# Patient Record
Sex: Female | Born: 1950 | Race: White | Hispanic: No | Smoking: Never smoker
Health system: Southern US, Community
[De-identification: ages and names within clinical notes are randomized; demographics above are authoritative.]

## PROBLEM LIST (undated history)

## (undated) DIAGNOSIS — J449 Chronic obstructive pulmonary disease, unspecified: Secondary | ICD-10-CM

## (undated) DIAGNOSIS — F329 Major depressive disorder, single episode, unspecified: Secondary | ICD-10-CM

## (undated) DIAGNOSIS — K219 Gastro-esophageal reflux disease without esophagitis: Secondary | ICD-10-CM

## (undated) DIAGNOSIS — N6019 Diffuse cystic mastopathy of unspecified breast: Secondary | ICD-10-CM

## (undated) DIAGNOSIS — E785 Hyperlipidemia, unspecified: Secondary | ICD-10-CM

## (undated) DIAGNOSIS — G43909 Migraine, unspecified, not intractable, without status migrainosus: Secondary | ICD-10-CM

## (undated) DIAGNOSIS — M1990 Unspecified osteoarthritis, unspecified site: Secondary | ICD-10-CM

## (undated) DIAGNOSIS — F32A Depression, unspecified: Secondary | ICD-10-CM

## (undated) DIAGNOSIS — G4730 Sleep apnea, unspecified: Secondary | ICD-10-CM

## (undated) DIAGNOSIS — E119 Type 2 diabetes mellitus without complications: Secondary | ICD-10-CM

## (undated) DIAGNOSIS — E669 Obesity, unspecified: Secondary | ICD-10-CM

## (undated) DIAGNOSIS — D649 Anemia, unspecified: Secondary | ICD-10-CM

## (undated) DIAGNOSIS — I10 Essential (primary) hypertension: Secondary | ICD-10-CM

## (undated) HISTORY — DX: Obesity, unspecified: E66.9

## (undated) HISTORY — DX: Diffuse cystic mastopathy of unspecified breast: N60.19

## (undated) HISTORY — PX: THROAT SURGERY: SHX803

## (undated) HISTORY — DX: Chronic obstructive pulmonary disease, unspecified: J44.9

## (undated) HISTORY — PX: GASTRIC RESTRICTION SURGERY: SHX653

## (undated) HISTORY — DX: Essential (primary) hypertension: I10

## (undated) HISTORY — DX: Migraine, unspecified, not intractable, without status migrainosus: G43.909

## (undated) HISTORY — DX: Unspecified osteoarthritis, unspecified site: M19.90

## (undated) HISTORY — PX: JOINT REPLACEMENT: SHX530

## (undated) HISTORY — PX: CYSTECTOMY: SUR359

## (undated) HISTORY — DX: Anemia, unspecified: D64.9

## (undated) HISTORY — DX: Depression, unspecified: F32.A

## (undated) HISTORY — DX: Sleep apnea, unspecified: G47.30

## (undated) HISTORY — DX: Major depressive disorder, single episode, unspecified: F32.9

## (undated) HISTORY — DX: Type 2 diabetes mellitus without complications: E11.9

## (undated) HISTORY — PX: APPENDECTOMY: SHX54

## (undated) HISTORY — DX: Hyperlipidemia, unspecified: E78.5

## (undated) HISTORY — DX: Gastro-esophageal reflux disease without esophagitis: K21.9

## (undated) HISTORY — PX: ABDOMINAL HYSTERECTOMY: SHX81

---

## 1996-09-13 HISTORY — PX: FOOT SURGERY: SHX648

## 1999-09-14 HISTORY — PX: SHOULDER SURGERY: SHX246

## 1999-09-14 HISTORY — PX: COLONOSCOPY: SHX174

## 2000-09-13 HISTORY — PX: TRACHEOSTOMY: SUR1362

## 2000-09-13 HISTORY — PX: CARPAL TUNNEL RELEASE: SHX101

## 2004-09-03 ENCOUNTER — Ambulatory Visit: Payer: Self-pay | Admitting: Otolaryngology

## 2004-09-03 ENCOUNTER — Other Ambulatory Visit: Payer: Self-pay

## 2004-09-09 ENCOUNTER — Ambulatory Visit: Payer: Self-pay | Admitting: Otolaryngology

## 2004-10-30 ENCOUNTER — Ambulatory Visit: Payer: Self-pay | Admitting: Otolaryngology

## 2004-11-04 ENCOUNTER — Ambulatory Visit: Payer: Self-pay | Admitting: Otolaryngology

## 2004-12-31 ENCOUNTER — Ambulatory Visit: Payer: Self-pay | Admitting: General Surgery

## 2005-01-05 ENCOUNTER — Emergency Department: Payer: Self-pay | Admitting: Unknown Physician Specialty

## 2005-01-07 ENCOUNTER — Ambulatory Visit: Payer: Self-pay | Admitting: Family Medicine

## 2005-01-08 ENCOUNTER — Inpatient Hospital Stay: Payer: Self-pay | Admitting: Internal Medicine

## 2005-02-20 ENCOUNTER — Other Ambulatory Visit: Payer: Self-pay

## 2005-02-21 ENCOUNTER — Inpatient Hospital Stay: Payer: Self-pay | Admitting: Rheumatology

## 2005-04-22 ENCOUNTER — Emergency Department: Payer: Self-pay | Admitting: Emergency Medicine

## 2006-01-11 ENCOUNTER — Ambulatory Visit: Payer: Self-pay | Admitting: General Surgery

## 2006-03-10 ENCOUNTER — Emergency Department: Payer: Self-pay | Admitting: Emergency Medicine

## 2007-01-24 ENCOUNTER — Ambulatory Visit: Payer: Self-pay | Admitting: General Surgery

## 2008-01-15 ENCOUNTER — Ambulatory Visit: Payer: Self-pay | Admitting: Family Medicine

## 2008-02-13 ENCOUNTER — Ambulatory Visit: Payer: Self-pay | Admitting: General Surgery

## 2009-02-13 ENCOUNTER — Ambulatory Visit: Payer: Self-pay | Admitting: General Surgery

## 2010-01-12 ENCOUNTER — Emergency Department: Payer: Self-pay | Admitting: Emergency Medicine

## 2010-02-16 ENCOUNTER — Ambulatory Visit: Payer: Self-pay | Admitting: General Surgery

## 2011-03-19 ENCOUNTER — Ambulatory Visit: Payer: Self-pay | Admitting: General Surgery

## 2011-03-27 ENCOUNTER — Emergency Department: Payer: Self-pay | Admitting: *Deleted

## 2011-09-28 ENCOUNTER — Emergency Department: Payer: Self-pay | Admitting: Emergency Medicine

## 2012-03-20 ENCOUNTER — Ambulatory Visit: Payer: Self-pay | Admitting: Family Medicine

## 2012-10-24 ENCOUNTER — Emergency Department: Payer: Self-pay | Admitting: Unknown Physician Specialty

## 2012-10-24 LAB — COMPREHENSIVE METABOLIC PANEL
Anion Gap: 4 — ABNORMAL LOW (ref 7–16)
BUN: 13 mg/dL (ref 7–18)
Co2: 34 mmol/L — ABNORMAL HIGH (ref 21–32)
Creatinine: 1.01 mg/dL (ref 0.60–1.30)
EGFR (African American): >60
Osmolality: 275 (ref 275–301)
Potassium: 4.1 mmol/L (ref 3.5–5.1)
SGOT(AST): 12 U/L — ABNORMAL LOW (ref 15–37)
SGPT (ALT): 14 U/L (ref 12–78)

## 2012-10-24 LAB — URINALYSIS, COMPLETE
Bilirubin,UR: NEGATIVE
Blood: NEGATIVE
Glucose,UR: NEGATIVE mg/dL (ref 0–75)
Protein: NEGATIVE
RBC,UR: <1 /HPF (ref 0–5)
Specific Gravity: 1.004 (ref 1.003–1.030)
WBC UR: <1 /HPF (ref 0–5)

## 2012-10-24 LAB — CBC
HCT: 39.9 % (ref 35.0–47.0)
MCH: 23.3 pg — ABNORMAL LOW (ref 26.0–34.0)
Platelet: 221 10*3/uL (ref 150–440)
RBC: 5.19 10*6/uL (ref 3.80–5.20)
RDW: 18.6 % — ABNORMAL HIGH (ref 11.5–14.5)
WBC: 12.1 10*3/uL — ABNORMAL HIGH (ref 3.6–11.0)

## 2012-12-12 ENCOUNTER — Other Ambulatory Visit (HOSPITAL_COMMUNITY): Payer: Self-pay | Admitting: Family Medicine

## 2012-12-12 DIAGNOSIS — Z13820 Encounter for screening for osteoporosis: Secondary | ICD-10-CM

## 2012-12-19 ENCOUNTER — Ambulatory Visit: Payer: Self-pay | Admitting: Family Medicine

## 2013-01-04 ENCOUNTER — Telehealth: Payer: Self-pay | Admitting: *Deleted

## 2013-01-04 NOTE — Telephone Encounter (Signed)
Patient was contacted to see if she could come in and see Dr. Evette Cristal for an appointment tomorrow, 01-05-13. This patient depends on transportation. She will be calling us back to let us know either way.

## 2013-01-05 ENCOUNTER — Encounter: Payer: Self-pay | Admitting: General Surgery

## 2013-01-05 ENCOUNTER — Other Ambulatory Visit: Payer: Self-pay

## 2013-01-05 ENCOUNTER — Ambulatory Visit (INDEPENDENT_AMBULATORY_CARE_PROVIDER_SITE_OTHER): Payer: Medicare PPO | Admitting: General Surgery

## 2013-01-05 VITALS — BP 112/58 | HR 66 | Resp 18 | Ht 61.0 in | Wt 382.0 lb

## 2013-01-05 DIAGNOSIS — N63 Unspecified lump in unspecified breast: Secondary | ICD-10-CM

## 2013-01-05 DIAGNOSIS — N6019 Diffuse cystic mastopathy of unspecified breast: Secondary | ICD-10-CM

## 2013-01-05 NOTE — Progress Notes (Signed)
Patient ID: Terri Silva, female   DOB: 27-Apr-1951, 62 y.o.   MRN: [id number]  Chief Complaint  Patient presents with  . Follow-up    category 4 mammogram    HPI Terri Silva is a 62 y.o. female who presents for a follow up mammogram. The most recent mammogram was done on 12/19/12 with a birad category 4. Left breast ultrasound was performed on 12/20/12 with a category 4. Left breast pain since the end of February 2014. The pain starts at the upper inner quadrant of the left breast and radiates down to the nipple. No discharge is noticed. No lumps found but she feels an elongated vein like structure in the left breast. She does self breast checks approximately every other month and gets yearly mammograms. She has a history of cysts in her breasts. She has strong family history of breast cancer.  HPI  Past Medical History  Diagnosis Date  . Diabetes mellitus without complication   . Hypertension   . Hyperlipidemia   . Arthritis   . Anemia   . Sleep apnea   . Migraine   . GERD (gastroesophageal reflux disease)   . Diffuse cystic mastopathy   . COPD (chronic obstructive pulmonary disease)   . Depression   . Obesity, unspecified     Past Surgical History  Procedure Laterality Date  . Joint replacement    . Appendectomy    . Throat surgery    . Shoulder surgery Left 2001  . Abdominal hysterectomy    . Gastric restriction surgery    . Cesarean section    . Foot surgery  1998  . Cystectomy    . Tracheostomy  2002  . Colonoscopy  2001  . Carpal tunnel release  2002    Family History  Problem Relation Age of Onset  . Cancer Maternal Aunt 8    breast  . Cancer Maternal Aunt 70    breast  . Cancer Cousin     breast  . Cancer Cousin     breast    Social History History  Substance Use Topics  . Smoking status: Never Smoker   . Smokeless tobacco: Never Used  . Alcohol Use: No    Allergies  Allergen Reactions  . Codeine Nausea And Vomiting  . Morphine And Related  Swelling  . Tape Itching and Other (See Comments)    Blisters  . Ivp Dye (Iodinated Diagnostic Agents) Itching and Rash  . Latex Itching and Rash    blisters    Current Outpatient Prescriptions  Medication Sig Dispense Refill  . Albuterol Sulfate (VENTOLIN HFA IN) Inhale 1-2 puffs into the lungs as needed.      Marland Kitchen amLODipine (NORVASC) 5 MG tablet Take 1 tablet by mouth daily.      Marland Kitchen aspirin 81 MG tablet Take 81 mg by mouth daily.      . baclofen (LIORESAL) 10 MG tablet Take 1 tablet by mouth daily.      . benzonatate (TESSALON) 100 MG capsule Take 100 mg by mouth as needed for cough.      . calcium carbonate (OS-CAL) 600 MG TABS Take 600 mg by mouth 2 (two) times daily with a meal.      . Casanthranol-Docusate Sodium 30-100 MG CAPS Take 1 tablet by mouth 2 (two) times daily.      . CELEBREX 200 MG capsule Take 1 capsule by mouth daily.      . Cholecalciferol (VITAMIN D) 2000 UNITS CAPS Take 1 capsule  by mouth daily.      . Diclofenac Sodium (PENNSAID TD) Place 1 application onto the skin as needed.      . furosemide (LASIX) 20 MG tablet Take 1 tablet by mouth 2 (two) times daily.      Marland Kitchen HUMULIN 70/30 (70-30) 100 UNIT/ML injection 80 units subcutaneous in the AM 69 units in the PM.      . HYDROcodone-acetaminophen (VICODIN) 5-500 MG per tablet Take 1 tablet by mouth as needed.      Marland Kitchen ipratropium-albuterol (DUONEB) 0.5-2.5 (3) MG/3ML SOLN Take 3 mLs by nebulization as needed.      Marland Kitchen lisinopril (PRINIVIL,ZESTRIL) 40 MG tablet Take 1 tablet by mouth daily.      . methadone (DOLOPHINE) 5 MG tablet Take 1 tablet by mouth 6 (six) times daily.      Marland Kitchen omeprazole (PRILOSEC) 20 MG capsule Take 1 capsule by mouth daily.      . simvastatin (ZOCOR) 80 MG tablet Take 1 tablet by mouth daily.       No current facility-administered medications for this visit.    Review of Systems Review of Systems  Constitutional: Negative.   Respiratory: Negative.   Cardiovascular: Negative.     Blood pressure  112/58, pulse 66, resp. rate 18, height 5\' 1"  (1.549 m), weight 382 lb (173.274 kg).  Physical Exam Physical Exam  Constitutional: She appears well-developed and well-nourished.  Neck: No mass and no thyromegaly present.  Patient has a functioning tracheostomy.  Cardiovascular: Normal rate, regular rhythm and normal heart sounds.   Pulmonary/Chest: Effort normal and breath sounds normal. Right breast exhibits no inverted nipple, no mass, no nipple discharge, no skin change and no tenderness. Left breast exhibits tenderness. Left breast exhibits no inverted nipple, no mass, no nipple discharge and no skin change.  Tenderness in the upper inner quadrant near medial edge of the left breast.  Lymphadenopathy:    She has no cervical adenopathy.    She has no axillary adenopathy.    Data Reviewed Mammogram shoiws multiple stable tiny nodules. [place] left breast showed no findingd in UIQ area where she has pain. However at 12 o'cl 2 adjoining hypo to anechoic masses were seen -6 and 4 mm size.  Assessment    Likely the [place] finding is benign. No findings in uiq area of left breast.     Plan    FNA of left breast mass -pt agreeable.        Reznor Ferrando G 01/06/2013, 6:19 AM

## 2013-01-06 ENCOUNTER — Encounter: Payer: Self-pay | Admitting: General Surgery

## 2013-01-06 DIAGNOSIS — N63 Unspecified lump in unspecified breast: Secondary | ICD-10-CM | POA: Insufficient documentation

## 2013-01-06 DIAGNOSIS — N6019 Diffuse cystic mastopathy of unspecified breast: Secondary | ICD-10-CM | POA: Insufficient documentation

## 2013-01-06 NOTE — Patient Instructions (Signed)
Cytology report will be related to pt when available. If benign will recheck in 2 mos

## 2013-01-10 ENCOUNTER — Ambulatory Visit (HOSPITAL_COMMUNITY)
Admission: RE | Admit: 2013-01-10 | Discharge: 2013-01-10 | Disposition: A | Discharge status: Home / Self Care | Payer: Medicare PPO | Source: Ambulatory Visit | Attending: Family Medicine | Admitting: Family Medicine

## 2013-01-10 DIAGNOSIS — Z13820 Encounter for screening for osteoporosis: Secondary | ICD-10-CM | POA: Insufficient documentation

## 2013-01-10 DIAGNOSIS — Z780 Asymptomatic menopausal state: Secondary | ICD-10-CM | POA: Insufficient documentation

## 2013-01-10 LAB — FINE-NEEDLE ASPIRATION

## 2013-01-16 ENCOUNTER — Telehealth: Payer: Self-pay | Admitting: *Deleted

## 2013-01-16 NOTE — Telephone Encounter (Signed)
Notified patient as instructed, biopsy "ok" patient pleased. Discussed follow-up appointments, 6 months, patient agrees

## 2013-03-07 ENCOUNTER — Other Ambulatory Visit: Payer: Self-pay

## 2013-03-07 ENCOUNTER — Ambulatory Visit (INDEPENDENT_AMBULATORY_CARE_PROVIDER_SITE_OTHER): Payer: Medicare PPO | Admitting: General Surgery

## 2013-03-07 ENCOUNTER — Encounter: Payer: Self-pay | Admitting: General Surgery

## 2013-03-07 VITALS — BP 120/80 | HR 84 | Resp 18 | Ht 61.0 in | Wt 390.0 lb

## 2013-03-07 DIAGNOSIS — N63 Unspecified lump in unspecified breast: Secondary | ICD-10-CM

## 2013-03-07 DIAGNOSIS — Z1231 Encounter for screening mammogram for malignant neoplasm of breast: Secondary | ICD-10-CM

## 2013-03-07 NOTE — Patient Instructions (Addendum)
Patient to have a screening mammogram next month. This has been arranged for 04-04-13 at 1 pm [facility]). She is aware of date, time, and instructions.

## 2013-03-07 NOTE — Progress Notes (Signed)
Patient ID: Terri Silva, female   DOB: 1950-10-25, 62 y.o.   MRN: [id number]  Chief Complaint  Patient presents with  . Other    HPI Terri Silva is a 62 y.o. female here today following up from a  left breast fine needle aspiration  2 months ago, cytology was benign. Patient  states no new problems at this time. Here for f/u left breast US  HPI  Past Medical History  Diagnosis Date  . Diabetes mellitus without complication   . Hypertension   . Hyperlipidemia   . Arthritis   . Anemia   . Sleep apnea   . Migraine   . GERD (gastroesophageal reflux disease)   . Diffuse cystic mastopathy   . COPD (chronic obstructive pulmonary disease)   . Depression   . Obesity, unspecified     Past Surgical History  Procedure Laterality Date  . Joint replacement    . Appendectomy    . Throat surgery    . Shoulder surgery Left 2001  . Abdominal hysterectomy    . Gastric restriction surgery    . Cesarean section    . Foot surgery  1998  . Cystectomy    . Tracheostomy  2002  . Colonoscopy  2001  . Carpal tunnel release  2002    Family History  Problem Relation Age of Onset  . Cancer Maternal Aunt 25    breast  . Cancer Maternal Aunt 70    breast  . Cancer Cousin     breast  . Cancer Cousin     breast    Social History History  Substance Use Topics  . Smoking status: Never Smoker   . Smokeless tobacco: Never Used  . Alcohol Use: No    Allergies  Allergen Reactions  . Codeine Nausea And Vomiting  . Morphine And Related Swelling  . Tape Itching and Other (See Comments)    Blisters  . Ivp Dye (Iodinated Diagnostic Agents) Itching and Rash  . Latex Itching and Rash    blisters    Current Outpatient Prescriptions  Medication Sig Dispense Refill  . Albuterol Sulfate (VENTOLIN HFA IN) Inhale 1-2 puffs into the lungs as needed.      Marland Kitchen amLODipine (NORVASC) 5 MG tablet Take 1 tablet by mouth daily.      Marland Kitchen aspirin 81 MG tablet Take 81 mg by mouth daily.      . baclofen  (LIORESAL) 10 MG tablet Take 1 tablet by mouth daily.      . benzonatate (TESSALON) 100 MG capsule Take 100 mg by mouth as needed for cough.      . calcium carbonate (OS-CAL) 600 MG TABS Take 600 mg by mouth 2 (two) times daily with a meal.      . Casanthranol-Docusate Sodium 30-100 MG CAPS Take 1 tablet by mouth 2 (two) times daily.      . CELEBREX 200 MG capsule Take 1 capsule by mouth daily.      . Cholecalciferol (VITAMIN D) 2000 UNITS CAPS Take 1 capsule by mouth daily.      . Diclofenac Sodium (PENNSAID TD) Place 1 application onto the skin as needed.      . furosemide (LASIX) 20 MG tablet Take 1 tablet by mouth 2 (two) times daily.      Marland Kitchen HUMULIN 70/30 (70-30) 100 UNIT/ML injection 80 units subcutaneous in the AM 69 units in the PM.      . HYDROcodone-acetaminophen (VICODIN) 5-500 MG per tablet Take 1 tablet  by mouth as needed.      Marland Kitchen ipratropium-albuterol (DUONEB) 0.5-2.5 (3) MG/3ML SOLN Take 3 mLs by nebulization as needed.      Marland Kitchen lisinopril (PRINIVIL,ZESTRIL) 40 MG tablet Take 1 tablet by mouth daily.      . methadone (DOLOPHINE) 5 MG tablet Take 1 tablet by mouth 6 (six) times daily.      Marland Kitchen omeprazole (PRILOSEC) 20 MG capsule Take 1 capsule by mouth daily.      . simvastatin (ZOCOR) 80 MG tablet Take 1 tablet by mouth daily.       No current facility-administered medications for this visit.    Review of Systems Review of Systems  Constitutional: Negative.   Respiratory: Positive for shortness of breath. Negative for apnea, cough, choking, chest tightness, wheezing and stridor.   Cardiovascular: Positive for leg swelling. Negative for chest pain and palpitations.    Blood pressure 120/80, pulse 84, resp. rate 18, height 5\' 1"  (1.549 m), weight 390 lb (176.903 kg).  Physical Exam Physical ExamLeft breast with no palpable findings  Data Reviewed [place] today left breast shows several benign appearing nodules 12 -1 ocl on areolar margin. Prior nodule seen at 12 oo;cl are not well  seen today.  Assessment    Benign nodules left breast     Plan    Return to routine mammograms and exams.     Patient to have a screening mammogram next month. This has been arranged for 04-04-13 at 1 pm [facility]). She is aware of date, time, and instructions.    SANKAR,SEEPLAPUTHUR G 03/11/2013, 9:37 AM

## 2013-03-11 ENCOUNTER — Encounter: Payer: Self-pay | Admitting: General Surgery

## 2013-04-04 ENCOUNTER — Ambulatory Visit: Payer: Self-pay | Admitting: General Surgery

## 2013-04-12 ENCOUNTER — Ambulatory Visit (INDEPENDENT_AMBULATORY_CARE_PROVIDER_SITE_OTHER): Payer: Medicare PPO | Admitting: General Surgery

## 2013-04-12 ENCOUNTER — Encounter: Payer: Self-pay | Admitting: General Surgery

## 2013-04-12 VITALS — BP 140/72 | HR 70 | Resp 20 | Ht 63.0 in | Wt 389.0 lb

## 2013-04-12 DIAGNOSIS — N6019 Diffuse cystic mastopathy of unspecified breast: Secondary | ICD-10-CM

## 2013-04-12 NOTE — Progress Notes (Signed)
Patient ID: Terri Silva, female   DOB: 1951/04/21, 62 y.o.   MRN: [id number]  No chief complaint on file.   HPI Terri Silva is a 62 y.o. female  Here for follow up of screening mammogram done on 04/04/13. No new problems to report. She was seen 1 mo ago with some findings on [place] here-appear benign.  HPI  Past Medical History  Diagnosis Date  . Diabetes mellitus without complication   . Hypertension   . Hyperlipidemia   . Arthritis   . Anemia   . Sleep apnea   . Migraine   . GERD (gastroesophageal reflux disease)   . Diffuse cystic mastopathy   . COPD (chronic obstructive pulmonary disease)   . Depression   . Obesity, unspecified     Past Surgical History  Procedure Laterality Date  . Joint replacement    . Appendectomy    . Throat surgery    . Shoulder surgery Left 2001  . Abdominal hysterectomy    . Gastric restriction surgery    . Cesarean section    . Foot surgery  1998  . Cystectomy    . Tracheostomy  2002  . Colonoscopy  2001  . Carpal tunnel release  2002    Family History  Problem Relation Age of Onset  . Cancer Maternal Aunt 93    breast  . Cancer Maternal Aunt 70    breast  . Cancer Cousin     breast  . Cancer Cousin     breast    Social History History  Substance Use Topics  . Smoking status: Never Smoker   . Smokeless tobacco: Never Used  . Alcohol Use: No    Allergies  Allergen Reactions  . Codeine Nausea And Vomiting  . Morphine And Related Swelling  . Tape Itching and Other (See Comments)    Blisters  . Ivp Dye (Iodinated Diagnostic Agents) Itching and Rash  . Latex Itching and Rash    blisters    Current Outpatient Prescriptions  Medication Sig Dispense Refill  . Albuterol Sulfate (VENTOLIN HFA IN) Inhale 1-2 puffs into the lungs as needed.      Marland Kitchen amLODipine (NORVASC) 5 MG tablet Take 1 tablet by mouth daily.      Marland Kitchen aspirin 81 MG tablet Take 81 mg by mouth daily.      . baclofen (LIORESAL) 10 MG tablet Take 1 tablet by mouth  daily.      . benzonatate (TESSALON) 100 MG capsule Take 100 mg by mouth as needed for cough.      . calcium carbonate (OS-CAL) 600 MG TABS Take 600 mg by mouth 2 (two) times daily with a meal.      . Casanthranol-Docusate Sodium 30-100 MG CAPS Take 1 tablet by mouth 2 (two) times daily.      . CELEBREX 200 MG capsule Take 1 capsule by mouth daily.      . Cholecalciferol (VITAMIN D) 2000 UNITS CAPS Take 1 capsule by mouth daily.      . Diclofenac Sodium (PENNSAID TD) Place 1 application onto the skin as needed.      . furosemide (LASIX) 20 MG tablet Take 1 tablet by mouth 2 (two) times daily.      Marland Kitchen HUMULIN 70/30 (70-30) 100 UNIT/ML injection 80 units subcutaneous in the AM 69 units in the PM.      . HYDROcodone-acetaminophen (VICODIN) 5-500 MG per tablet Take 1 tablet by mouth as needed.      Marland Kitchen  ipratropium-albuterol (DUONEB) 0.5-2.5 (3) MG/3ML SOLN Take 3 mLs by nebulization as needed.      Marland Kitchen lisinopril (PRINIVIL,ZESTRIL) 40 MG tablet Take 1 tablet by mouth daily.      . methadone (DOLOPHINE) 5 MG tablet Take 1 tablet by mouth 6 (six) times daily.      Marland Kitchen omeprazole (PRILOSEC) 20 MG capsule Take 1 capsule by mouth daily.      . simvastatin (ZOCOR) 80 MG tablet Take 1 tablet by mouth daily.       No current facility-administered medications for this visit.    Review of Systems Review of Systems  Constitutional: Negative.   Respiratory: Negative.   Cardiovascular: Negative.     Blood pressure 140/72, pulse 70, resp. rate 20, height 5\' 3"  (1.6 m), weight 389 lb (176.449 kg).  Physical Exam Physical Exam  Pulmonary/Chest: Left breast exhibits no mass, no skin change and no tenderness.  Left breast shows no palpable abnormality    Data Reviewed Mammogram shows stable bilateral nodularity   Assessment    stable exam  Plan    One year follow up screening mammogram        SANKAR,SEEPLAPUTHUR G 04/13/2013, 11:34 AM

## 2013-04-13 ENCOUNTER — Encounter: Payer: Self-pay | Admitting: General Surgery

## 2013-05-31 IMAGING — US ULTRASOUND LEFT BREAST
1 series · 13 of 22 positions shown · non-contrast
Comparison: none

REASON FOR EXAM: LT BR CYSTIC AREA 12 OCLOCK
COMMENTS:

PROCEDURE:     US  - US BREAST LEFT  - December 19, 2012  [DATE]
RESULT:     COMPARISON:  03/20/2012, 02/16/2010
TECHNIQUE: Digital diagnostic left mammograms were obtained. FDA approved
computer-aided detection (CAD) for mammography was utilized for this study.
BREAST COMPOSITION: The breast composition is HETEROGENEOUSLY DENSE
(glandular tissue is 51-75%). This may decrease the sensitivity of
mammography.

[Series 1: ultrasound left breast · 0.12mm/px · 13 of 22 slices shown]
[im 1/22]
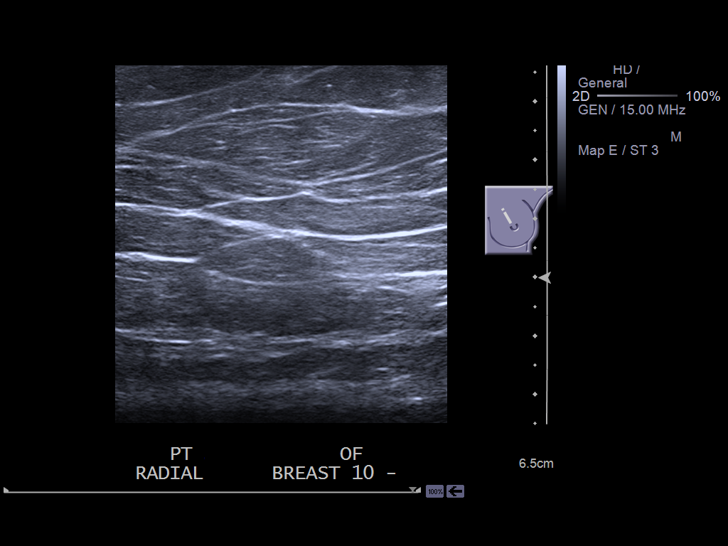
[im 3/22]
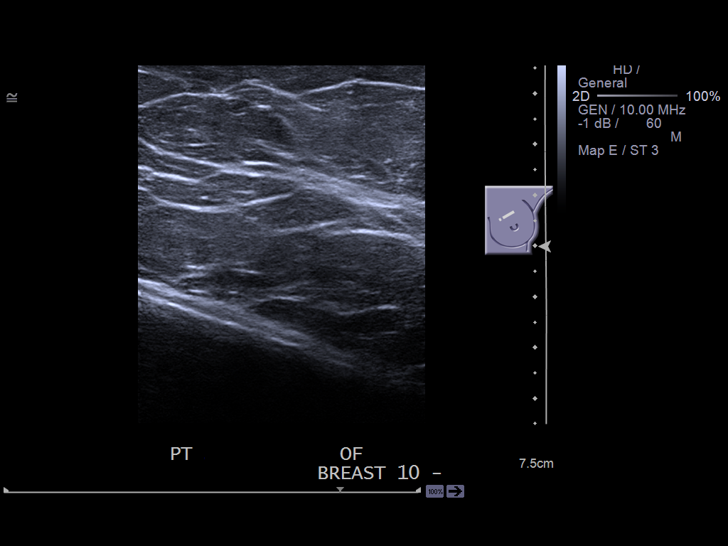
[im 5/22]
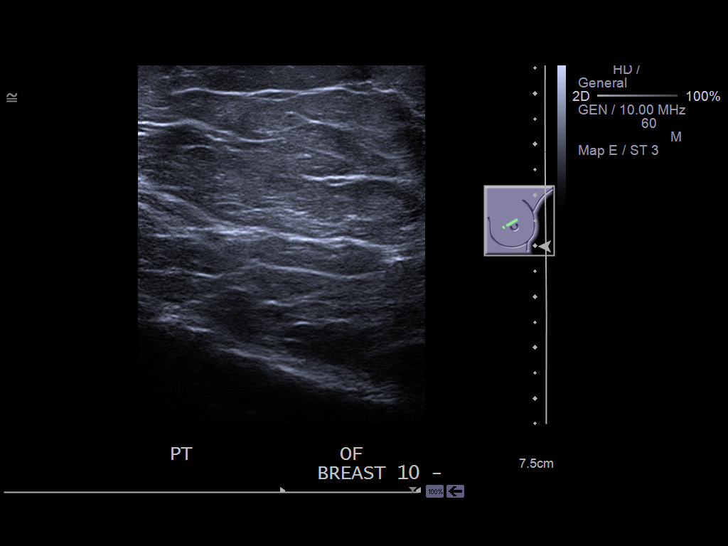
[im 6/22]
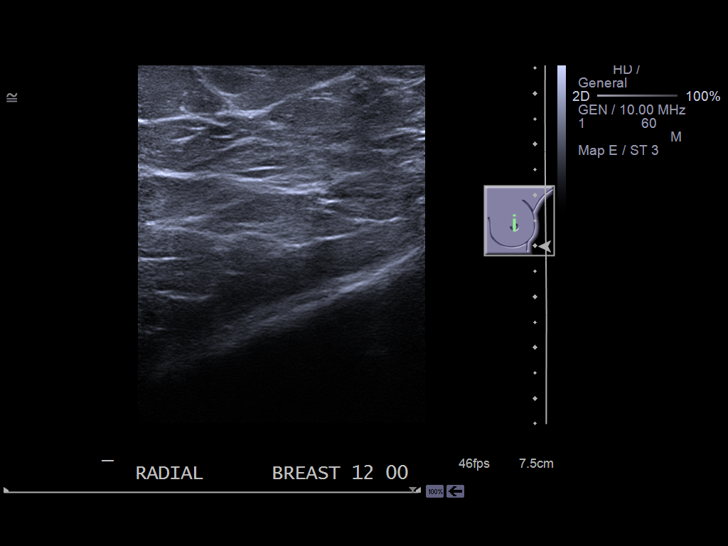
[im 8/22]
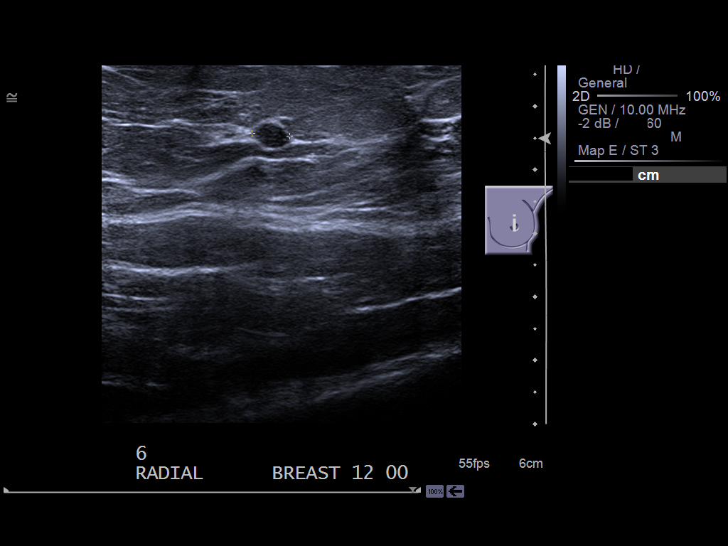
[im 10/22]
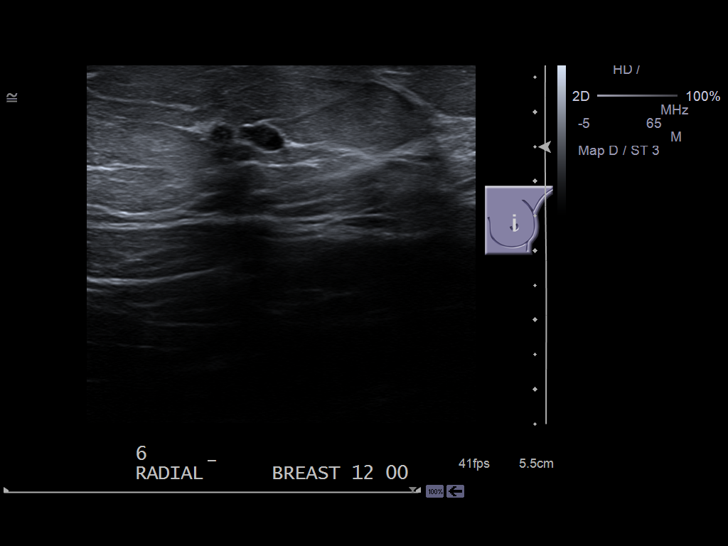
[im 12/22]
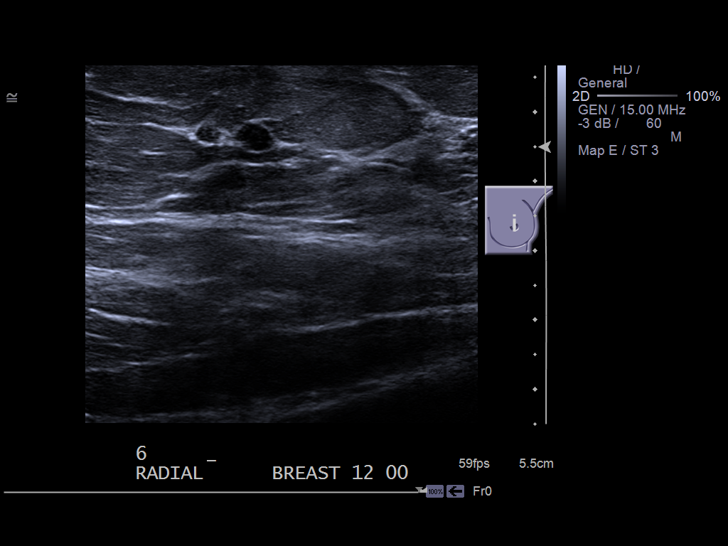
[im 13/22]
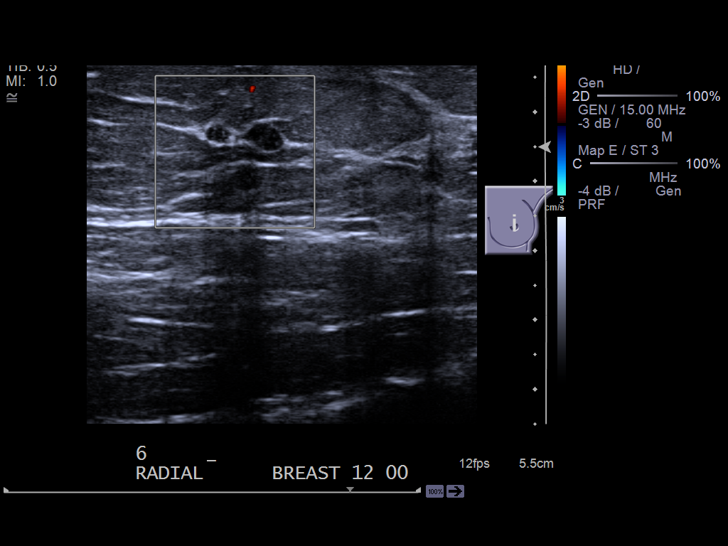
[im 15/22]
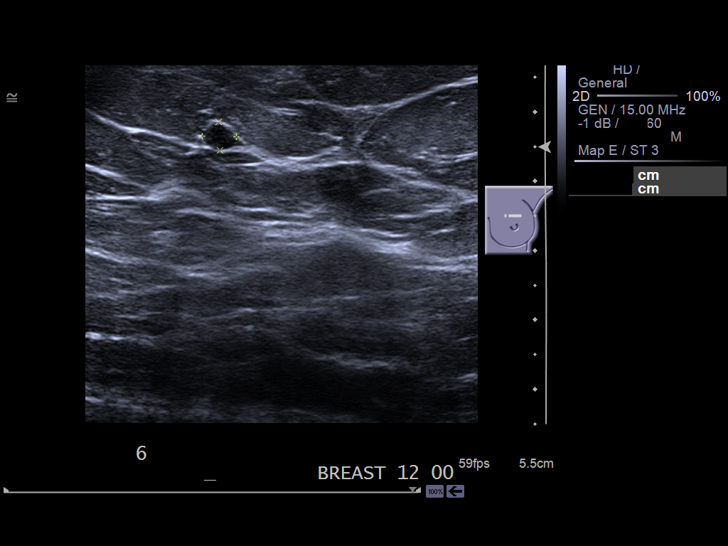
[im 17/22]
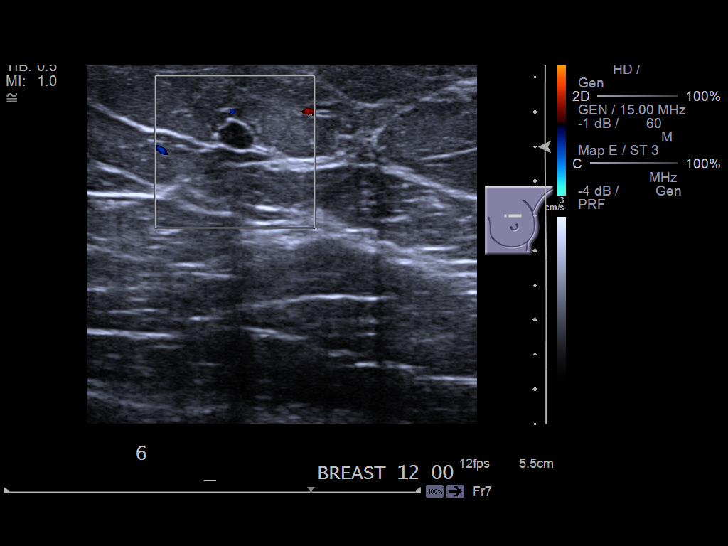
[im 18/22]
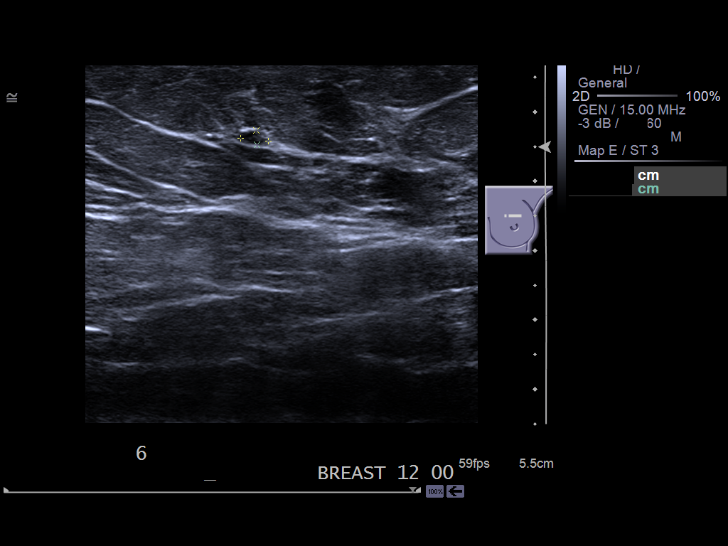
[im 20/22]
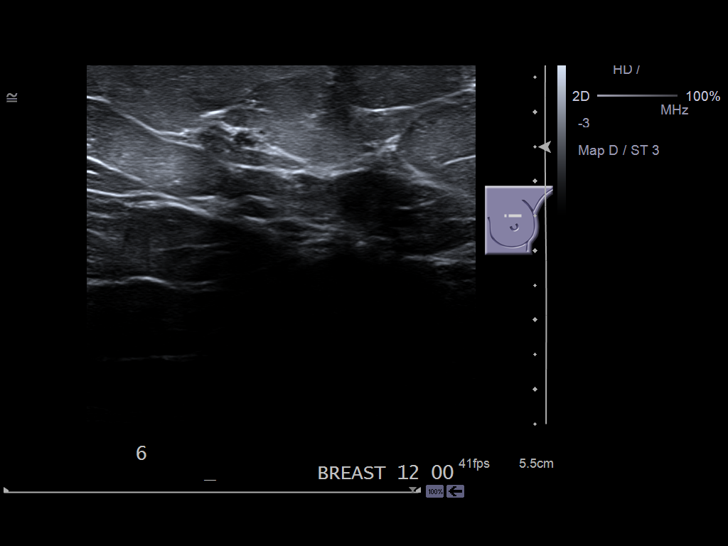
[im 22/22]
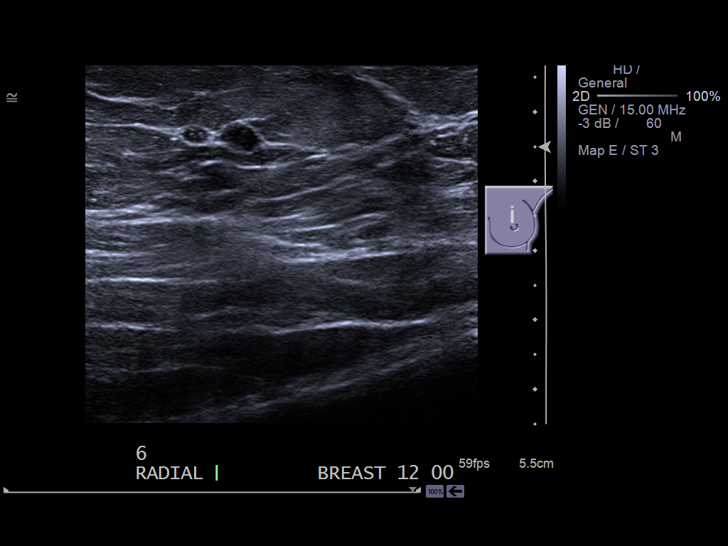

[13 of 22 positions shown; findings below may reference images not displayed]

FINDING: There are a few small rounded masses in the upper outer left breast
unchanged compared with 02/16/2010. There is no new dominant mass,
architectural distortion or clusters of suspicious microcalcifications.

Real-time sonography of the left breast was performed from the [DATE]
position to the [DATE] position. There are 2 hypoechoic masses at the [DATE]
position measuring 6 x 5 x 5 mm and 4 x 3 x 4 mm with internal echoes. There
is no increased through transmission.
IMPRESSION: 1.   Two hypoechoic masses at the [DATE] position in the left breast.
Recommend fine needle aspiration. Differential diagnoses include solid mass
versus operative cyst.

BI-RADS:  Category 4 - Suspicious Abnormality

A negative mammogram report does not preclude biopsy or other evaluation of
a clinically palpable or otherwise suspicious mass or lesion. Breast cancer
may not be detected by mammography in up to 10% of cases.

[REDACTED]

## 2013-09-01 ENCOUNTER — Inpatient Hospital Stay: Payer: Self-pay | Admitting: Specialist

## 2013-09-01 LAB — CBC
HCT: 37.5 % (ref 35.0–47.0)
MCH: 23.8 pg — ABNORMAL LOW (ref 26.0–34.0)
MCHC: 30.8 g/dL — ABNORMAL LOW (ref 32.0–36.0)
Platelet: 185 10*3/uL (ref 150–440)

## 2013-09-01 LAB — COMPREHENSIVE METABOLIC PANEL
Albumin: 2.9 g/dL — ABNORMAL LOW (ref 3.4–5.0)
Alkaline Phosphatase: 78 U/L
Anion Gap: 3 — ABNORMAL LOW (ref 7–16)
Bilirubin,Total: 0.6 mg/dL (ref 0.2–1.0)
Creatinine: 1.05 mg/dL (ref 0.60–1.30)
EGFR (African American): >60
EGFR (Non-African Amer.): 57 — ABNORMAL LOW
Glucose: 198 mg/dL — ABNORMAL HIGH (ref 65–99)
Osmolality: 282 (ref 275–301)
Potassium: 3.7 mmol/L (ref 3.5–5.1)
SGOT(AST): 15 U/L (ref 15–37)
SGPT (ALT): 16 U/L (ref 12–78)

## 2013-09-01 LAB — TROPONIN I: Troponin-I: 0.02 ng/mL

## 2013-09-01 LAB — URINALYSIS, COMPLETE
Bacteria: NONE SEEN
Blood: NEGATIVE
Specific Gravity: 1.004 (ref 1.003–1.030)

## 2013-09-01 LAB — CK TOTAL AND CKMB (NOT AT ARMC): CK, Total: 66 U/L (ref 21–215)

## 2013-09-01 LAB — PRO B NATRIURETIC PEPTIDE: B-Type Natriuretic Peptide: 126 pg/mL — ABNORMAL HIGH (ref 0–125)

## 2013-09-02 LAB — CBC WITH DIFFERENTIAL/PLATELET
HCT: 37.7 % (ref 35.0–47.0)
Lymphocyte %: 6.4 %
MCH: 23.5 pg — ABNORMAL LOW (ref 26.0–34.0)
MCV: 76 fL — ABNORMAL LOW (ref 80–100)
Monocyte %: 2.6 %
Neutrophil #: 11.6 10*3/uL — ABNORMAL HIGH (ref 1.4–6.5)

## 2013-09-02 LAB — MAGNESIUM: Magnesium: 1.5 mg/dL — ABNORMAL LOW

## 2013-09-02 LAB — BASIC METABOLIC PANEL
Anion Gap: 5 — ABNORMAL LOW (ref 7–16)
BUN: 18 mg/dL (ref 7–18)
Co2: 34 mmol/L — ABNORMAL HIGH (ref 21–32)
Osmolality: 281 (ref 275–301)
Potassium: 4.2 mmol/L (ref 3.5–5.1)
Sodium: 135 mmol/L — ABNORMAL LOW (ref 136–145)

## 2014-02-11 DEATH — deceased

## 2014-02-13 IMAGING — CR DG CHEST 1V PORT
1 series · 1 of 1 positions shown · non-contrast
Comparison: 09/01/2013

CLINICAL DATA: COPD

EXAM:
PORTABLE CHEST - 1 VIEW

[ap]
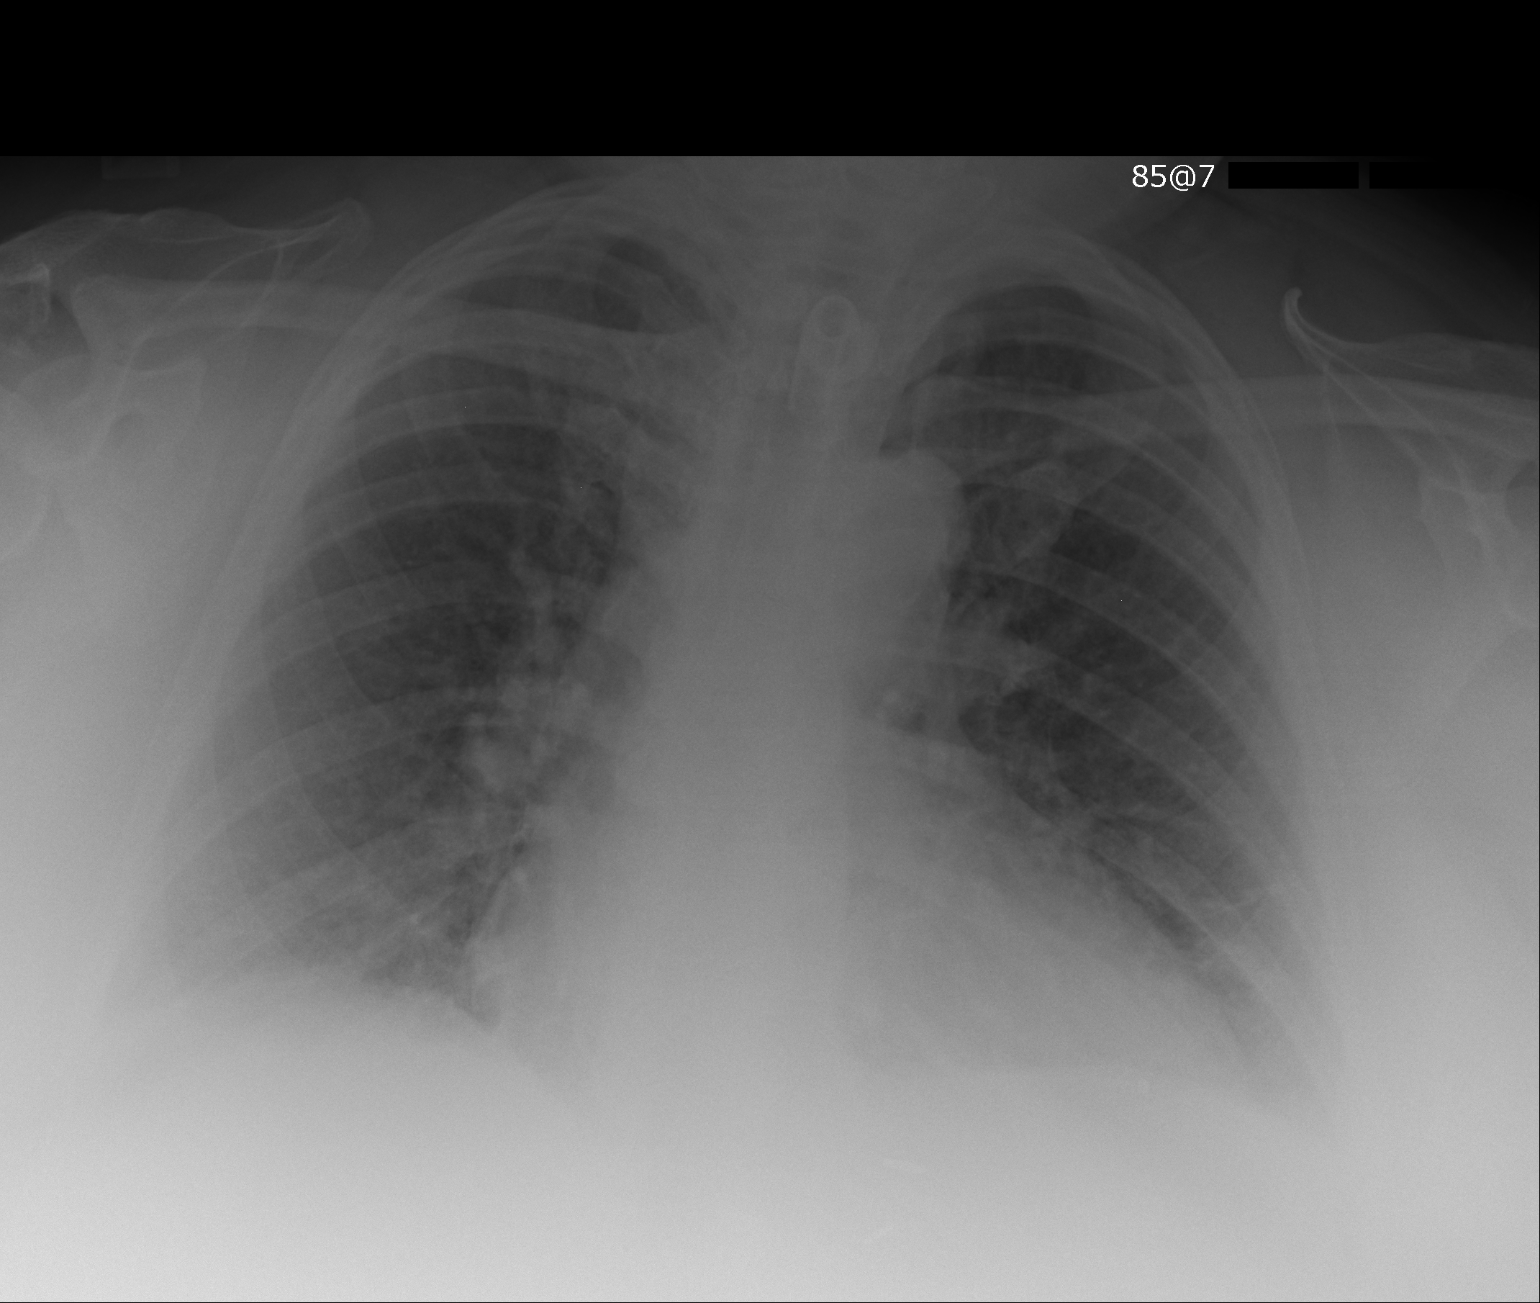

[1 of 1 positions shown; findings below may reference images not displayed]

FINDINGS: Stable tracheostomy in the mid trachea. Cardiomegaly persists with
Stable vascular congestion. Minimal basilar atelectasis. No definite
CHF. No enlarging effusion or pneumothorax. No new collapse or
consolidation.
IMPRESSION: Cardiomegaly with vascular congestion.  Basilar atelectasis.

## 2014-05-01 ENCOUNTER — Ambulatory Visit: Payer: Medicare PPO | Admitting: General Surgery

## 2014-07-04 ENCOUNTER — Encounter: Payer: Self-pay | Admitting: *Deleted

## 2014-07-15 ENCOUNTER — Encounter: Payer: Self-pay | Admitting: General Surgery

## 2015-01-03 NOTE — H&P (Signed)
PATIENT NAME:  Terri Silva, Terri Silva MR#:  [id number] DATE OF BIRTH:  20-Jul-1951  DATE OF ADMISSION:  09/01/2013  REFERRING PHYSICIAN:  Dr. Margarita Grizzle.   PRIMARY CARE PHYSICIAN:  Dr. Dorothey Baseman.   CHIEF COMPLAINT:  Shortness of breath and weakness.   HISTORY OF PRESENT ILLNESS:  This is a very nice 64 year old female who has a history of multiple medical problems including obesity, hyperventilatory syndrome with sleep apnea, hypertension, hyperlipidemia, diabetes, asthma. 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 She comes today with a history of feeling really "bad" at the beginning of the week, on Monday. It started with some weakness, feeling more short of breath, had a significant headache that went away after a day. Started to feel shakes and chills all over her body, and whenever she got up and walk around and felt unbalanced. She has been having trouble breathing for years, but she felt like a significant change including increased cough, increased wheezing, increased congestion and increased sputum. The patient is not quite sure if she has had any fever, but she feels very warm and the chills came up mostly today. The patient is not on oxygen at home. She has a tracheostomy that she wears up during the morning and uncapped at night due to her severe sleep apnea. She is not able to tolerate a BiPAP or CPAP at home. The patient is receiving some nebulizers and she feels that she is starting to improve. Her oxygen saturation was decreased by the time that she came over was documented at 40 but on the monitor by ER physician it is 85. The patient is admitted for treatment of a COPD exacerbation. Chest x-ray did not show any significant infiltrates, but there is some changes could be related to interstitial lung disease versus CHF. The patient is admitted in good condition.   REVIEW OF SYSTEMS:  A 12-system review of systems is done.  CONSTITUTIONAL:  Denies any  fever, but she feels hot, has chills, she is very fatigued and denies any significant weight loss. She has had a weight gain. She had a gastric bypass several years ago and her weight went down to 180 pounds. Today, she is 400 pounds.  EYES:  No blurry vision, double vision.  ENT:  No difficulty swallowing. No tinnitus. The patient snores and she has a tracheostomy due to inability to tolerate a CPAP.  RESPIRATORY:  Positive cough. Positive wheezing. No hemoptysis. Positive dyspnea. The patient does not get around very much. She is mostly bedbound, couch-bound and wheelchair-bound.  GENITOURINARY:  No dysuria, hematuria, or changes in frequency.  GASTROINTESTINAL:  No nausea, vomiting, abdominal pain, constipation or diarrhea GYNECOLOGIC:  No breast masses or other GYN problems.  ENDOCRINE:  No polyuria, polydipsia, polyphagia, cold or heat intolerance. The patient states that her glucose is well controlled with the insulin. HEMATOLOGIC AND LYMPHATIC:  No anemia, easy bruising or bleeding.  SKIN:  No rashes, petechiae or new lesions.  MUSCULOSKELETAL:  Positive chronic back pain, chronic joint pain, bilateral knee replacements, multiple joint pains. No gout.  NEUROLOGIC:  No numbness, tingling. No CVAs or TIAs.  PSYCHIATRIC:  No significant anxiety or depression. The patient has significant insomnia. Her depression has been well controlled through the years.   PAST MEDICAL HISTORY:  1.  Obese hyperventilatory syndrome.  2.  Sleep apnea.  3.  Status post tracheostomy.  4.  Hypertension.  5.  Insulin-dependent diabetes.  6.  Asthma.  7.  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  8.  Depression, stable.  9.  Chronic pain syndrome. 10.  Lower back pain. 11.   DJD.   12.  Hyperlipidemia.  13.  Gastroesophageal reflux.   HOME OXYGEN:  None.   PAST SURGICAL HISTORY:  1.  Tracheostomy 2002 due to bad COPD and sleep apnea.  2.  Bilateral knee replacement.  3.  Left shoulder surgery.  4.   Carpal tunnel release surgery.  5.  Gastric bypass.  6.  Throat polyps removed by Dr. Andi Devon.  7.  Cardiac catheterization, 2006. The patient did not have any have significant coronary artery disease.   ALLERGIES:  CODEINE, IVP CONTRAST, MORPHINE AND LATEX.   SOCIAL HISTORY:  The patient is a widow. She lives by herself. She does not smoke. She has never smoked. She is a second-hand smoker, does not drink alcohol. She is disabled.   FAMILY HISTORY:  Positive for coronary artery disease and diabetes in her mother. His father had a heart attack at the age 67. He is still alive. Her mother had bad CHF.    PHYSICAL EXAMINATION:  VITAL SIGNS:  Blood pressure 150/67, temperature 98.8, oxygen saturation 88% documented on vital signs, 85% by ER physician, pulse 82, respirations 32.  GENERAL:  The patient was very agitated, uses respiratory accessory muscles whenever she talks but whenever she is resting she looks more comfortable. Alert and oriented x 3.  HEENT:  Pupils are equal and reactive. Extraocular movements are intact. Mucosa are moist. Anicteric sclerae. Pink conjunctivae, no oral lesions. No oropharyngeal exudates. Her neck is fat. It has a tracheostomy in place with a cap. No bleeding or signs of infection around it. No carotid bruits. No rigidity.  CARDIOVASCULAR:  Regular rate and rhythm. No murmurs, rubs or gallops. No displacement of PMI.  LUNGS:  Overall decreased respiratory sounds all over with some distal wheezing and some crackling. Positive use of accessory muscles whenever she talks or whenever she has removed her nebulizer from her mouth.  ABDOMEN:  Soft, nontender, nondistended, obese. No hepatosplenomegaly. No masses.  GENITAL:  Exam is deferred.  EXTREMITIES:  No edema, cyanosis or clubbing. Pulses +2. Capillary refill less than 3.  NEUROLOGIC:  Cranial nerves II through XII intact. Strength is 5/5 in 4 extremities. Sensation is normal distally.  VASCULAR:  Pulses +2. Capillary  refill less than 3.  PSYCHIATRIC:  The patient is alert, oriented x 3, cooperative.  SKIN:  No rashes, petechiae,  LYMPHATIC:  Negative for lymphadenopathy in neck or supraclavicular areas.  LABORATORY, DIAGNOSTIC, AND RADIOLOGICAL DATA:  BNP is 126, glucose 198, BUN 14, creatinine 1.05. Her CO2 is 36 elevated, her calcium is 8.4. Other electrolytes within normal limits. Potassium 3.7, albumin is 2.9. LFTs within normal limits. Troponin is 0.02. White count is 10, hemoglobin 11.6, platelet count is 185, microcytic anemia. Urinalysis normal. No signs of infection.   CHEST X-RAY: Bilateral interstitial prominence, likely interstitial edema versus interstitial lung disease.   EKG:  Normal sinus rhythm.   ASSESSMENT AND PLAN: This is a 64 year old female with a history of morbid obesity, obese hyperventilatory syndrome, obstructive sleep apnea, hypertension, diabetes, chronic obstructive pulmonary disease, and comes with increased weakness, increased shortness of breath, increased congestion.  1.  Acute respiratory failure. Oxygen saturation went down to 85% as described by the ER physician, documented 88%. The patient is using accessory muscles, in some distress with a respiratory rate of 32 now. She is more comfortable after some nebulizers. We  are going to keep her in the hospital with antibiotics, Levaquin, to increase her progression and improve her outcome. We are going to put her nebulizers with albuterol and Atrovent. We are going to continue her Dulera, supplemental oxygen and work on pulmonary toilet. Steroids are going to be given IV as the patient was wheezing significantly. Continue to monitor. The patient is a patient of Dr. Meredeth IdeFleming. Consult him as necessary.  2.  Chronic obstructive pulmonary disease exacerbation: As mentioned above, we are going to use nebulizers and steroids with antibiotics.  3.  Sleep apnea. The patient cannot tolerate CPAP, leave the tracheostomy uncapped at night,  monitor her oxygen saturations.  4.  Hypertension. The patient has an elevation of her blood pressure, we are going to monitor closely. Continue her lisinopril for now.  5.  Diabetes.  Her diabetes is going to be uncontrolled due to the dose of steroids. We are going to check insulin sliding scale, Check Accu-Cheks and give her home dose of insulin 70/30, 80 units in the morning, 66 at night and address as necessary.  6.  As far as her depression, it is very control.  7.  As far as her chronic pain syndrome, the patient takes methadone. Continue methadone. Other medical problems are stable.  8.  Gastrointestinal prophylaxis with proton pump inhibitor.  9.  Deep vein thrombosis prophylaxis with Lovenox.   CODE STATUS:  The patient is a DO NOT RESUSCITATE.    TIME SPENT:  I spent about 60 minutes with this patient and acute respiratory failure started to get better.   ____________________________ Felipa Furnaceoberto Sanchez Gutierrez, MD [id number] D: 09/01/2013 14:23:00 ET T: 09/01/2013 15:00:38 ET JOB#: [id number]  cc: Felipa Furnaceoberto Sanchez Gutierrez, MD, <Dictator> Dulse Rutan Juanda ChanceSANCHEZ GUTIERRE MD ELECTRONICALLY SIGNED 09/03/2013 15:01

## 2015-01-03 NOTE — Discharge Summary (Signed)
PATIENT NAME:  Terri Silva, Terri Silva MR#:  [id number] DATE OF BIRTH:  03-Feb-1951  DATE OF ADMISSION:  09/01/2013 DATE OF DISCHARGE:  09/03/2013  For a detailed note, please look at the history and physical done on admission by Dr. Mordecai Maes.   DIAGNOSES AT DISCHARGE: As follows:  1.  Acute on chronic respiratory failure secondary to chronic obstructive pulmonary disease exacerbation.  2.  Chronic obstructive pulmonary disease exacerbation.  3.  Obesity pickwickian syndrome.  4.  Diabetes.  5.  Chronic pain syndrome.  6.  Hyperlipidemia.  7.  Status post tracheostomy.   The patient is being discharged on a low-sodium, carb-controlled diet.   ACTIVITY: As tolerated.   Followup is with Dr. Terance Hart in the next 1 to 2 weeks.   DISCHARGE MEDICATIONS: Lisinopril 40 mg daily, amlodipine 5 mg daily, Lasix 20 mg daily, omeprazole 20 mg daily, methadone 5 mg 4 tabs daily, simvastatin 80 mg at bedtime, vitamin D3, 2000 International Units daily; baclofen 10 mg b.i.d., Tylenol with hydrocodone 5/325 mg 1 tab q.6 hours as needed, Colace 100 mg b.i.d., calcium carbonate 600 mg b.i.d., aspirin 81 mg daily, Celebrex 200 mg daily, Humulin 70/30, 80 units in the morning, 66 units in the evening; Dulera 2 puffs b.i.d., prednisone taper starting at 60 mg, down to 10 mg over the next 6 days; nystatin topical to be applied b.i.d. to the inguinal area.   The patient is also being discharged on 2 liters of oxygen continuous.   PERTINENT STUDIES DONE DURING THE HOSPITAL COURSE: Are as follows: Chest x-ray done on admission showing cardiomegaly with vascular congestion and interstitial prominence. A repeat chest x-ray done on December 22nd showing bibasilar atelectasis, but no acute disease.   HOSPITAL COURSE: This is a 64 year old female with medical problems as mentioned above, presented to the hospital with weakness and shortness of breath and noted to be in chronic obstructive pulmonary disease exacerbation.  1.   Acute on chronic respiratory failure. This was likely secondary to COPD exacerbation. The patient was treated with IV steroids, around-the-clock nebulizer treatments, maintained on her Dulera. Her clinical symptoms have significantly improved since admission. She is less bronchospastic and not wheezing. She was ambulated on room air and did desaturate below 88%; therefore, qualified for home oxygen, which is being arranged for her. She had 2 chest x-rays while in the hospital, which showed no evidence of pneumonia. She was empirically on Levaquin, but currently is not being discharged on any antibiotics. She will finish her prednisone taper for her COPD exacerbation.  2.  Chronic obstructive pulmonary disease exacerbation, likely the cause of the patient's acute on chronic respiratory failure. Again, the patient was treated with IV steroids, around-the-clock nebulizer treatments, maintained on her Dulera. She has clinically significantly improved, is being discharged on a prednisone taper. Since her chest x-rays have remained negative, she is not being discharged on any antibiotics presently. She did qualify for home oxygen, which is being arranged for her prior to discharge.  3.  Diabetes. The patient was maintained on her NovoLog 70/30, along with sliding scale insulin coverage. His sugars are stable. She will continue that for now.  4.  Hypertension. The patient was maintained on her Norvasc and lisinopril. She will resume that.  5.  Hyperlipidemia. The patient was maintained on her simvastatin. She will resume that.  6.  Chronic pain. The patient is on methadone. She will resume that.  7.  Dizziness. This is probably related to her obesity pickwickian syndrome and  her COPD. She did have orthostatic vital signs checked, which were negative prior to discharge.  8.  Fungal infection. Given her significant obesity, the patient has a fungal infection in inguinal area. She is being discharged on some nystatin  powder as stated.   The patient is a DO NOT INTUBATE/DO NOT RESUSCITATE.   TIME SPENT: 40 minutes.   ____________________________ Rolly PancakeVivek J. Cherlynn KaiserSainani, MD [id number]:[id number] D: 09/03/2013 16:08:48 ET T: 09/03/2013 22:44:19 ET JOB#: [id number]  cc: Rolly PancakeVivek J. Cherlynn KaiserSainani, MD, <Dictator> Teena Iraniavid M. Terance HartBronstein, MD Houston SirenVIVEK J SAINANI MD ELECTRONICALLY SIGNED 09/06/2013 13:41
